# Patient Record
Sex: Male | Born: 1990 | Race: White | Hispanic: No | Marital: Single | Smoking: Current every day smoker
Health system: Southern US, Community
[De-identification: ages and names within clinical notes are randomized; demographics above are authoritative.]

## PROBLEM LIST (undated history)

## (undated) HISTORY — PX: LEG SURGERY: SHX1003

## (undated) HISTORY — PX: ORIF TIBIA & FIBULA FRACTURES: SHX2131

---
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
## 2012-04-27 ENCOUNTER — Encounter (HOSPITAL_BASED_OUTPATIENT_CLINIC_OR_DEPARTMENT_OTHER): Payer: Self-pay | Admitting: *Deleted

## 2012-04-27 ENCOUNTER — Emergency Department (HOSPITAL_BASED_OUTPATIENT_CLINIC_OR_DEPARTMENT_OTHER): Payer: Self-pay

## 2012-04-27 ENCOUNTER — Inpatient Hospital Stay (HOSPITAL_BASED_OUTPATIENT_CLINIC_OR_DEPARTMENT_OTHER)
Admission: EM | Admit: 2012-04-27 | Discharge: 2012-04-29 | DRG: 194 | Disposition: A | Discharge status: Home / Self Care | Payer: Self-pay | Attending: Internal Medicine | Admitting: Internal Medicine

## 2012-04-27 DIAGNOSIS — J189 Pneumonia, unspecified organism: Principal | ICD-10-CM | POA: Diagnosis present

## 2012-04-27 DIAGNOSIS — D72829 Elevated white blood cell count, unspecified: Secondary | ICD-10-CM | POA: Diagnosis present

## 2012-04-27 DIAGNOSIS — F17200 Nicotine dependence, unspecified, uncomplicated: Secondary | ICD-10-CM | POA: Diagnosis present

## 2012-04-27 DIAGNOSIS — E871 Hypo-osmolality and hyponatremia: Secondary | ICD-10-CM | POA: Diagnosis present

## 2012-04-27 DIAGNOSIS — R0902 Hypoxemia: Secondary | ICD-10-CM | POA: Diagnosis present

## 2012-04-27 DIAGNOSIS — R4182 Altered mental status, unspecified: Secondary | ICD-10-CM | POA: Diagnosis present

## 2012-04-27 LAB — BASIC METABOLIC PANEL
CO2: 27 mEq/L (ref 19–32)
Chloride: 97 mEq/L (ref 96–112)
Creatinine, Ser: 1.2 mg/dL (ref 0.50–1.35)
Glucose, Bld: 127 mg/dL — ABNORMAL HIGH (ref 70–99)

## 2012-04-27 LAB — CBC WITH DIFFERENTIAL/PLATELET
Eosinophils Relative: 2 % (ref 0–5)
HCT: 45 % (ref 39.0–52.0)
Lymphocytes Relative: 3 % — ABNORMAL LOW (ref 12–46)
Lymphs Abs: 0.7 10*3/uL (ref 0.7–4.0)
MCV: 84.1 fL (ref 78.0–100.0)
Monocytes Absolute: 1.3 10*3/uL — ABNORMAL HIGH (ref 0.1–1.0)
RBC: 5.35 MIL/uL (ref 4.22–5.81)
RDW: 12.2 % (ref 11.5–15.5)
WBC: 21.9 10*3/uL — ABNORMAL HIGH (ref 4.0–10.5)

## 2012-04-27 MED ORDER — SODIUM CHLORIDE 0.9 % IV SOLN: INTRAVENOUS | Status: DC

## 2012-04-27 MED ORDER — DEXTROSE 5 % IV SOLN
500.0000 mg | Freq: Once | INTRAVENOUS | Status: AC
  Administered 2012-04-27: 500 mg via INTRAVENOUS
  Filled 2012-04-27: qty 500

## 2012-04-27 MED ORDER — SODIUM CHLORIDE 0.9 % IV BOLUS (SEPSIS)
2000.0000 mL | Freq: Once | INTRAVENOUS | Status: AC
  Administered 2012-04-28: 2000 mL via INTRAVENOUS

## 2012-04-27 MED ORDER — SODIUM CHLORIDE 0.9 % IV BOLUS (SEPSIS)
1000.0000 mL | Freq: Once | INTRAVENOUS | Status: AC
  Administered 2012-04-27: 1000 mL via INTRAVENOUS

## 2012-04-27 MED ORDER — AZITHROMYCIN 250 MG PO TABS: 250.0000 mg | ORAL_TABLET | Freq: Every day | ORAL | Status: DC

## 2012-04-27 MED ORDER — DEXTROSE 5 % IV SOLN
2.0000 g | Freq: Once | INTRAVENOUS | Status: AC
  Administered 2012-04-27: 2 g via INTRAVENOUS
  Filled 2012-04-27: qty 2

## 2012-04-27 NOTE — ED Provider Notes (Addendum)
Medical screening examination/treatment/procedure(s) were conducted as a shared visit with non-physician practitioner(s) and myself.  I personally evaluated the patient during the encounter  2324 ED Dispo plan to admit and order BCs and antibiotics IV.  Pt with 2 days cough, weakness, fatigue, sleeping more than usual, short of breath, girlfriend's mom states Pt was able to walk himself to car tonight, but appears confused now in ED after Pt to Nsg and PA initially did not appear confused, Pt now oriented to self only not to time or place, O2 sat 90-96%, L:CTA, tachypneic without resp distress and able to speak full sentences, Pt alternates between falling asleep during questioning and waking up to swear and state he is leaving but does not appear to have capacity to leave AMA, Pt's girlfriend and her mom at bedside understand and are trying to convince Pt to cooperate for admit/transfer. 2345  [facility], Pt with mild hypoxia in ED RA 87% improves to mid-90s with N/C O2; Pt now cooperative and agrees to transfer.0100  CRITICAL CARE Performed by: Hurman Horn   Total critical care time:  Critical care time was exclusive of separately billable procedures and treating other patients.  Critical care was necessary to treat or prevent imminent or life-threatening deterioration.  Critical care was time spent personally by me on the following activities: development of treatment plan with patient and/or surrogate as well as nursing, discussions with consultants, evaluation of patient's response to treatment, examination of patient, obtaining history from patient or surrogate, ordering and performing treatments and interventions, ordering and review of laboratory studies, ordering and review of radiographic studies, pulse oximetry and re-evaluation of patient's condition.  Hurman Horn, MD 04/28/12 1603  Hurman Horn, MD 05/03/12 2152

## 2012-04-27 NOTE — ED Notes (Signed)
Pt states he has been sick for 2 days with a cough and fever. Passed out x 2. Not taking deep breaths. BBS-diminished

## 2012-04-27 NOTE — ED Provider Notes (Signed)
History     CSN: [id number]  Arrival date & time 04/27/12  2012   First MD Initiated Contact with Patient 04/27/12 2037      Chief Complaint  Patient presents with  . Shortness of Breath    (Consider location/radiation/quality/duration/timing/severity/associated sxs/prior treatment) HPI Comments: Patient comes in today with a chief complaint of fever, dry cough, decreased appetite, fatigue, headache, nausea, decreased energy, and body aches.  Symptoms began yesterday and have become progressively worse.  He is unsure how high his fever has been because he has not taken his temperature.  He denies any vomiting or diarrhea.  He also reports that he has had several episodes of near syncope today.  He has been feeling dizzy and lightheaded intermittently throughout the day.  Friends with him today state that they haven't actually seen him pass out.  He just puts his head back and will fall asleep for 45 minutes.   He denies any changes in his vision.  Denies neck pain or stiffness.  Denies rash.  Denies chest pain.  No sick contacts.  He has not taken anything for his symptoms.  He does smoke cigarettes.  He states that he just started smoking one week ago.  He does not have a PCP.  The history is provided by the patient.    History reviewed. No pertinent past medical history.  Past Surgical History  Procedure Date  . Leg surgery     History reviewed. No pertinent family history.  History  Substance Use Topics  . Smoking status: Current Some Day Smoker  . Smokeless tobacco: Not on file  . Alcohol Use: Yes      Review of Systems  Constitutional: Positive for fever, chills, appetite change and fatigue.  HENT: Negative for sore throat, neck pain and neck stiffness.   Eyes: Negative for photophobia and visual disturbance.  Respiratory: Positive for cough and shortness of breath. Negative for wheezing.   Cardiovascular: Negative for chest pain and leg swelling.  Gastrointestinal:  Positive for nausea. Negative for vomiting, abdominal pain and diarrhea.  Skin: Negative for rash.  Neurological: Positive for dizziness, light-headedness and headaches. Negative for syncope.  Psychiatric/Behavioral: Negative for confusion.    Allergies  Review of patient's allergies indicates no known allergies.  Home Medications   Current Outpatient Rx  Name Route Sig Dispense Refill  . IBUPROFEN 200 MG PO TABS Oral Take 400 mg by mouth every 6 (six) hours as needed. For headache.      BP 117/68  Pulse 120  Temp 99.9 F (37.7 C) (Oral)  Resp 18  Ht 5\' 11"  (1.803 m)  Wt 150 lb (68.04 kg)  BMI 20.92 kg/m2  SpO2 94%  Physical Exam  Nursing note and vitals reviewed. Constitutional: He is oriented to person, place, and time. He appears well-developed and well-nourished. No distress.  HENT:  Head: Normocephalic and atraumatic.  Right Ear: Tympanic membrane and ear canal normal.  Left Ear: Tympanic membrane and ear canal normal.  Nose: Nose normal.  Mouth/Throat: Oropharynx is clear and moist and mucous membranes are normal.  Eyes: EOM are normal. Pupils are equal, round, and reactive to light.  Neck: Normal range of motion. Neck supple.  Cardiovascular: Normal rate, regular rhythm and normal heart sounds.   Pulmonary/Chest: Effort normal. No respiratory distress. He has decreased breath sounds. He has no wheezes.  Abdominal: Soft. Bowel sounds are normal. He exhibits no distension and no mass. There is no tenderness. There is no rebound and  no guarding.  Musculoskeletal: Normal range of motion.  Neurological: He is alert and oriented to person, place, and time.  Skin: Skin is warm and dry. He is not diaphoretic.  Psychiatric: He has a normal mood and affect.    ED Course  Procedures (including critical care time)  Labs Reviewed - No data to display Dg Chest 2 View  04/27/2012  *RADIOLOGY REPORT*  Clinical Data: 21 year old male with cough, fever, syncope, sickness.   CHEST - 2 VIEW  Comparison: None.  Findings: Widespread increased interstitial markings, mostly linear and peri hilar.  No definite pleural effusion.  No consolidation or confluent pulmonary opacity. Normal cardiac size and mediastinal contours.  Visualized tracheal air column is within normal limits. No pneumothorax. No acute osseous abnormality identified.  IMPRESSION: Abnormal increased linear/interstitial markings in both lungs suspicious for acute viral / atypical pneumonia.  Original Report Authenticated By: Harley Hallmark, M.D.     No diagnosis found.   Date: 04/27/2012  Rate: 104  Rhythm: sinus tachycardia  QRS Axis: normal  Intervals: normal  ST/T Wave abnormalities: normal  Conduction Disutrbances:none  Narrative Interpretation:   Old EKG Reviewed: none available  10:00 PM Patient signed out to Dr. Fonnie Jarvis.  Patient currently getting IVF.  BMP pending.  MDM  Patient presenting today with cough, fever, headache, body aches, and fatigue.  CXR shows possible atypical Pneumonia.  CBC shows elevated WBC.  BMP pending.  Patient has also been feeling lightheaded intermittently.  Patient receiving IVF.          Pascal Lux Dahlgren, PA-C 04/29/12 1237

## 2012-04-27 NOTE — ED Notes (Signed)
Called to room because visitor concerned with O2 sat dropping. Pt was resting quietly. No distress. Encouraged to take deep breaths. Sats increased to 100% on RA. Dr. Fonnie Jarvis in to talk with pt and visitor.

## 2012-04-28 ENCOUNTER — Encounter (HOSPITAL_COMMUNITY): Payer: Self-pay | Admitting: *Deleted

## 2012-04-28 DIAGNOSIS — J189 Pneumonia, unspecified organism: Secondary | ICD-10-CM | POA: Diagnosis present

## 2012-04-28 DIAGNOSIS — R0902 Hypoxemia: Secondary | ICD-10-CM | POA: Diagnosis present

## 2012-04-28 DIAGNOSIS — D72829 Elevated white blood cell count, unspecified: Secondary | ICD-10-CM

## 2012-04-28 DIAGNOSIS — R4182 Altered mental status, unspecified: Secondary | ICD-10-CM | POA: Diagnosis present

## 2012-04-28 DIAGNOSIS — E871 Hypo-osmolality and hyponatremia: Secondary | ICD-10-CM | POA: Diagnosis present

## 2012-04-28 LAB — ACETAMINOPHEN LEVEL: Acetaminophen (Tylenol), Serum: <15 ug/mL (ref 10–30)

## 2012-04-28 LAB — HEPATIC FUNCTION PANEL
AST: 7 U/L (ref 0–37)
Albumin: 3.3 g/dL — ABNORMAL LOW (ref 3.5–5.2)
Bilirubin, Direct: 0.3 mg/dL (ref 0.0–0.3)

## 2012-04-28 LAB — URINALYSIS, ROUTINE W REFLEX MICROSCOPIC
Glucose, UA: NEGATIVE mg/dL
Hgb urine dipstick: NEGATIVE
Ketones, ur: 15 mg/dL — AB
pH: 7.5 (ref 5.0–8.0)

## 2012-04-28 LAB — RAPID URINE DRUG SCREEN, HOSP PERFORMED
Opiates: NOT DETECTED
Tetrahydrocannabinol: NOT DETECTED

## 2012-04-28 LAB — SALICYLATE LEVEL: Salicylate Lvl: <2 mg/dL — ABNORMAL LOW (ref 2.8–20.0)

## 2012-04-28 LAB — URINE MICROSCOPIC-ADD ON

## 2012-04-28 MED ORDER — ACETAMINOPHEN 325 MG PO TABS: 650.0000 mg | ORAL_TABLET | Freq: Four times a day (QID) | ORAL | Status: DC | PRN

## 2012-04-28 MED ORDER — OXYCODONE HCL 5 MG PO TABS: 5.0000 mg | ORAL_TABLET | ORAL | Status: DC | PRN

## 2012-04-28 MED ORDER — SODIUM CHLORIDE 0.9 % IV SOLN: INTRAVENOUS | Status: DC

## 2012-04-28 MED ORDER — ONDANSETRON HCL 4 MG PO TABS: 4.0000 mg | ORAL_TABLET | Freq: Four times a day (QID) | ORAL | Status: DC | PRN

## 2012-04-28 MED ORDER — ALBUTEROL SULFATE (5 MG/ML) 0.5% IN NEBU
2.5000 mg | INHALATION_SOLUTION | Freq: Four times a day (QID) | RESPIRATORY_TRACT | Status: DC
  Administered 2012-04-28: 2.5 mg via RESPIRATORY_TRACT
  Filled 2012-04-28: qty 0.5

## 2012-04-28 MED ORDER — ZOLPIDEM TARTRATE 5 MG PO TABS: 5.0000 mg | ORAL_TABLET | Freq: Every evening | ORAL | Status: DC | PRN

## 2012-04-28 MED ORDER — GUAIFENESIN ER 600 MG PO TB12
600.0000 mg | ORAL_TABLET | Freq: Two times a day (BID) | ORAL | Status: DC
  Administered 2012-04-28 – 2012-04-29 (×2): 600 mg via ORAL
  Filled 2012-04-28 (×3): qty 1

## 2012-04-28 MED ORDER — ALBUTEROL SULFATE (5 MG/ML) 0.5% IN NEBU: 2.5000 mg | INHALATION_SOLUTION | RESPIRATORY_TRACT | Status: DC | PRN

## 2012-04-28 MED ORDER — DEXTROSE 5 % IV SOLN
500.0000 mg | INTRAVENOUS | Status: DC
  Administered 2012-04-28: 500 mg via INTRAVENOUS
  Filled 2012-04-28 (×2): qty 500

## 2012-04-28 MED ORDER — ONDANSETRON HCL 4 MG/2ML IJ SOLN: 4.0000 mg | Freq: Four times a day (QID) | INTRAMUSCULAR | Status: DC | PRN

## 2012-04-28 MED ORDER — ACETAMINOPHEN 325 MG PO TABS
650.0000 mg | ORAL_TABLET | Freq: Once | ORAL | Status: AC
  Administered 2012-04-28: 650 mg via ORAL
  Filled 2012-04-28: qty 2

## 2012-04-28 MED ORDER — ALBUTEROL SULFATE HFA 108 (90 BASE) MCG/ACT IN AERS
2.0000 | INHALATION_SPRAY | Freq: Three times a day (TID) | RESPIRATORY_TRACT | Status: DC
  Administered 2012-04-28 – 2012-04-29 (×3): 2 via RESPIRATORY_TRACT
  Filled 2012-04-28: qty 6.7

## 2012-04-28 MED ORDER — HYDROMORPHONE HCL PF 1 MG/ML IJ SOLN: 0.5000 mg | INTRAMUSCULAR | Status: DC | PRN

## 2012-04-28 MED ORDER — ACETAMINOPHEN 650 MG RE SUPP: 650.0000 mg | Freq: Four times a day (QID) | RECTAL | Status: DC | PRN

## 2012-04-28 MED ORDER — ALUM & MAG HYDROXIDE-SIMETH 200-200-20 MG/5ML PO SUSP: 30.0000 mL | Freq: Four times a day (QID) | ORAL | Status: DC | PRN

## 2012-04-28 MED ORDER — ALBUTEROL SULFATE HFA 108 (90 BASE) MCG/ACT IN AERS
2.0000 | INHALATION_SPRAY | RESPIRATORY_TRACT | Status: DC | PRN
  Filled 2012-04-28: qty 6.7

## 2012-04-28 MED ORDER — DEXTROSE 5 % IV SOLN
1.0000 g | INTRAVENOUS | Status: DC
  Administered 2012-04-29: 1 g via INTRAVENOUS
  Filled 2012-04-28 (×3): qty 10

## 2012-04-28 MED ORDER — ENOXAPARIN SODIUM 40 MG/0.4ML ~~LOC~~ SOLN
40.0000 mg | SUBCUTANEOUS | Status: DC
  Filled 2012-04-28 (×2): qty 0.4

## 2012-04-28 NOTE — H&P (Signed)
[facility] History and Physical  Nicholas Ellis ZOX:[id number] DOB: [birth year] DOA: 04/27/2012  Referring physician: EDP PCP: No primary provider on file.   Chief Complaint: Cough Chest Congestion, Fever  HPI:  21 year old male who presented to the [facility] ED with complaints of increased cough which has been nonproductive, fever and chills X 2 days.  He does report having a decreased appetite and nausea but no vomiting.  He reports passing out yesterday.  He has had increased weakness and fatigue.  In the Ed he was evaluated and found to have hypoxia with O2 sats decreasing to 87% on Room Air.  He improved after being placed on 2 liter [place] O2.  A Chest X-Ray revealed an atypical pneumonia.  He was placed on IV antibiotic therapy of Rocephin and Azithromycin.    Review of Systems:  The patient denies anorexia, fever, weight loss, vision loss, decreased hearing, hoarseness, chest pain, dyspnea on exertion, peripheral edema, balance deficits, hemoptysis, abdominal pain, melena, hematochezia, severe indigestion/heartburn, hematuria, incontinence, genital sores, muscle weakness, suspicious skin lesions, transient blindness, difficulty walking, depression, unusual weight change, abnormal bleeding, enlarged lymph nodes, angioedema, and breast masses.    History reviewed. No pertinent past medical history.   Past Surgical History  Procedure Date  . Leg surgery   . Broke knee 2010 2010    Prior to Admission medications   Medication Sig Start Date End Date Taking? Authorizing Provider  ibuprofen (ADVIL,MOTRIN) 200 MG tablet Take 400 mg by mouth every 6 (six) hours as needed. For headache.   Yes Historical Provider, MD    Allergies:  No Known Allergies   Social History:  reports that he has been smoking.  His smokeless tobacco use includes Chew. He reports that he drinks about 54 ounces of alcohol per week. He reports that he does not use illicit drugs.    Family History  Problem  Relation Age of Onset  . Breast cancer Maternal Aunt   . Breast cancer Maternal Aunt   . Breast cancer Maternal Aunt      Physical Exam:  GEN:  Pleasant  21 year old well nourished and well developed Caucasian Male examined  and in no acute distress; cooperative with exam Filed Vitals:   04/28/12 0216 04/28/12 0314 04/28/12 0409 04/28/12 0411  BP: 94/56 102/61  107/58  Pulse: 75 78  83  Temp:  97.9 F (36.6 C)  97.3 F (36.3 C)  TempSrc:  Oral  Oral  Resp: 16 16  16   Height:   5\' 9"  (1.753 m)   Weight:   63.594 kg (140 lb 3.2 oz)   SpO2: 98% 97%  96%   Blood pressure 107/58, pulse 83, temperature 97.3 F (36.3 C), temperature source Oral, resp. rate 16, height 5\' 9"  (1.753 m), weight 63.594 kg (140 lb 3.2 oz), SpO2 96.00%. PSYCH: He is alert and oriented x4; does not appear anxious does not appear depressed; affect is normal HEENT: Normocephalic and Atraumatic, Mucous membranes pink; PERRLA; EOM intact; Fundi:  Benign;  No scleral icterus, Nares: Patent, Oropharynx: Clear, Fair Dentition, Neck:  FROM, no cervical lymphadenopathy nor thyromegaly or carotid bruit; no JVD; Breasts:: Not examined CHEST WALL: No tenderness CHEST: Normal respiration, clear to auscultation bilaterally HEART: Regular rate and rhythm; no murmurs rubs or gallops BACK: No kyphosis or scoliosis; no CVA tenderness ABDOMEN: Positive Bowel Sounds, Scaphoid, soft non-tender; no masses, no organomegaly.   Rectal Exam: Not done EXTREMITIES: No bone or joint deformity; age-appropriate arthropathy of  the hands and knees; no cyanosis, clubbing or edema; no ulcerations. Genitalia: not examined PULSES: 2+ and symmetric SKIN: Normal hydration no rash or ulceration CNS: Cranial nerves 2-12 grossly intact no focal neurologic deficit   Labs on Admission:  Basic Metabolic Panel:  Lab 04/27/12 1610  NA 134*  K 3.9  CL 97  CO2 27  GLUCOSE 127*  BUN 14  CREATININE 1.20  CALCIUM 9.6  MG --  PHOS --   Liver  Function Tests:  Lab 04/28/12 0011  AST 7  ALT 6  ALKPHOS 55  BILITOT 1.8*  PROT 6.4  ALBUMIN 3.3*   No results found for this basename: LIPASE:5,AMYLASE:5 in the last 168 hours No results found for this basename: AMMONIA:5 in the last 168 hours CBC:  Lab 04/27/12 2154  WBC 21.9*  NEUTROABS 19.5*  HGB 16.0  HCT 45.0  MCV 84.1  PLT 183   Cardiac Enzymes: No results found for this basename: CKTOTAL:5,CKMB:5,CKMBINDEX:5,TROPONINI:5 in the last 168 hours  BNP (last 3 results) No results found for this basename: PROBNP:3 in the last 8760 hours CBG: No results found for this basename: GLUCAP:5 in the last 168 hours  Radiological Exams on Admission: Dg Chest 2 View  04/27/2012  *RADIOLOGY REPORT*  Clinical Data: 21 year old male with cough, fever, syncope, sickness.  CHEST - 2 VIEW  Comparison: None.  Findings: Widespread increased interstitial markings, mostly linear and peri hilar.  No definite pleural effusion.  No consolidation or confluent pulmonary opacity. Normal cardiac size and mediastinal contours.  Visualized tracheal air column is within normal limits. No pneumothorax. No acute osseous abnormality identified.  IMPRESSION: Abnormal increased linear/interstitial markings in both lungs suspicious for acute viral / atypical pneumonia.  Original Report Authenticated By: Harley Hallmark, M.D.    Assessment: Principal Problem:  *CAP (community acquired pneumonia) Active Problems:  Altered mental status  Hypoxia  Leukocytosis  Hyponatremia   Plan:    Admit to Telelmetry Bed IV Antibiotics to Cover CAP with Rocephin and Azithromycin Nebulizer Rxs, , and O2 PRN Reconcile Home Medications DVT prophylaxis   Code Status: FULL CODE Family Communication: Family at Bedside Disposition Plan: Return to Home  Time spent: 60 minutes  Ron Parker [facility] Pager [phone]  If 7PM-7AM, please contact night-coverage [email or web address] Password [facility] 04/28/2012,  6:33 AM

## 2012-04-28 NOTE — ED Notes (Signed)
Report called to [phone] RN

## 2012-04-28 NOTE — ED Notes (Signed)
Patient placed on 2L o2 due to decreased sats of 87%. Now increased to 94%

## 2012-04-28 NOTE — Progress Notes (Signed)
RT assessment done at this time. Patient is slightly SOB with exertion. BBS slightly diminished but clear. SAT 98% on 2 LNC. I feel that the patient would benefit from Albuterol MDI with Spacer TID. He is able to do a good breath hold. This way he will be able to do MDI upon discharge. RT will continue to monitor.

## 2012-04-28 NOTE — Progress Notes (Signed)
Patient admitted earlier today to [facility] with cough, fever and chills. He was found to have pneumonia. He was started on community-acquired coverage with Rocephin and azithromycin. Follow fever curve and leukocytosis. Will check HIV status.  Peggye Pitt, MD [facility] Pager: [phone]

## 2012-04-29 LAB — BASIC METABOLIC PANEL
CO2: 24 mEq/L (ref 19–32)
Chloride: 106 mEq/L (ref 96–112)
GFR calc Af Amer: >90 mL/min (ref >90)
Potassium: 3.6 mEq/L (ref 3.5–5.1)

## 2012-04-29 LAB — URINE CULTURE: Colony Count: NO GROWTH

## 2012-04-29 LAB — CBC
HCT: 39.3 % (ref 39.0–52.0)
Hemoglobin: 13.4 g/dL (ref 13.0–17.0)
MCV: 86.2 fL (ref 78.0–100.0)
WBC: 8.6 10*3/uL (ref 4.0–10.5)

## 2012-04-29 LAB — HIV ANTIBODY (ROUTINE TESTING W REFLEX): HIV: NONREACTIVE

## 2012-04-29 MED ORDER — LEVOFLOXACIN 500 MG PO TABS: 500.0000 mg | ORAL_TABLET | Freq: Every day | ORAL | Status: AC

## 2012-04-29 MED ORDER — GUAIFENESIN ER 600 MG PO TB12: 600.0000 mg | ORAL_TABLET | Freq: Two times a day (BID) | ORAL | Status: AC

## 2012-04-29 MED ORDER — ALBUTEROL SULFATE HFA 108 (90 BASE) MCG/ACT IN AERS: 2.0000 | INHALATION_SPRAY | RESPIRATORY_TRACT | Status: AC | PRN

## 2012-04-29 NOTE — Progress Notes (Signed)
Pt d/c home via ambulation.  Decline W/C service.  Amanda Pea, Charity fundraiser.

## 2012-04-29 NOTE — Discharge Summary (Signed)
Physician Discharge Summary  Patient ID: Nicholas Ellis MRN: [id number] DOB/AGE: 10/21/1990 21 y.o.  Admit date: 04/27/2012 Discharge date: 04/29/2012  Primary Care Physician:  No primary provider on file.   Discharge Diagnoses:    Principal Problem:  *CAP (community acquired pneumonia) Active Problems:  Altered mental status  Hypoxia  Leukocytosis  Hyponatremia    Medication List  As of 04/29/2012 10:07 AM   STOP taking these medications         ibuprofen 200 MG tablet         TAKE these medications         albuterol 108 (90 BASE) MCG/ACT inhaler   Commonly known as: PROVENTIL HFA;VENTOLIN HFA   Inhale 2 puffs into the lungs every 4 (four) hours as needed for wheezing or shortness of breath.      guaiFENesin 600 MG 12 hr tablet   Commonly known as: MUCINEX   Take 1 tablet (600 mg total) by mouth 2 (two) times daily.      levofloxacin 500 MG tablet   Commonly known as: LEVAQUIN   Take 1 tablet (500 mg total) by mouth daily.             Disposition and Follow-up:  Patient will be discharged home today in stable and improved condition. Will need primary care followup in about 10 days.  Consults:  None    Significant Diagnostic Studies:  Dg Chest 2 View  04/27/2012  *RADIOLOGY REPORT*  Clinical Data: 21 year old male with cough, fever, syncope, sickness.  CHEST - 2 VIEW  Comparison: None.  Findings: Widespread increased interstitial markings, mostly linear and peri hilar.  No definite pleural effusion.  No consolidation or confluent pulmonary opacity. Normal cardiac size and mediastinal contours.  Visualized tracheal air column is within normal limits. No pneumothorax. No acute osseous abnormality identified.  IMPRESSION: Abnormal increased linear/interstitial markings in both lungs suspicious for acute viral / atypical pneumonia.  Original Report Authenticated By: Harley Hallmark, M.D.    Brief H and P: For complete details please refer to admission H and P,  but in brief patient is a 21 year old young man who presented with increased cough fever and chills. Upon arrival to the emergency department he was found to be hypoxic to 87% on room air. A chest x-ray revealed an atypical pneumonia and we were asked to admit him for further evaluation and management.    Hospital Course:  Principal Problem:  *CAP (community acquired pneumonia) Active Problems:  Altered mental status  Hypoxia  Leukocytosis  Hyponatremia   #1 community-acquired pneumonia: He has been afebrile since admission. His leukocytosis has decreased from 21,000 to normal. His cough has greatly improved. He is no longer requiring oxygen and saturating mid to high 90s on room air. I believe he is ready for discharge home on 10 more days of by mouth Levaquin. He has been  instructed to quit smoking.  Time spent on Discharge: Greater than 30 minutes.  SignedChaya Jan [facility] Pager: [phone] 04/29/2012, 10:07 AM

## 2012-05-04 LAB — CULTURE, BLOOD (ROUTINE X 2): Culture: NO GROWTH

## 2013-07-09 ENCOUNTER — Ambulatory Visit: Payer: Self-pay | Admitting: General Practice

## 2013-07-24 ENCOUNTER — Ambulatory Visit: Payer: Self-pay | Admitting: General Practice

## 2013-09-16 ENCOUNTER — Emergency Department: Payer: Self-pay | Admitting: Emergency Medicine

## 2013-09-17 LAB — RAPID INFLUENZA A&B ANTIGENS (ARMC ONLY)

## 2014-09-21 IMAGING — CR DG WRIST COMPLETE 3+V*R*
1 series · 4 of 4 positions shown · non-contrast
Comparison: none

REASON FOR EXAM: ulnar wrist pain
COMMENTS:

PROCEDURE:     DXR - DXR WRIST RT COMP WITH OBLIQUES  - July 09, 2013  [DATE]
RESULT:     Right wrist images demonstrate possible tiny avulsion along the
medial base of the right fifth metacarpal. Correlate clinically. The carpus
appears unremarkable. The bony structures otherwise appear intact.

[Series 1: x wrist pa right · 0.14mm/px · 4 of 4 slices shown]
[im 1/4]
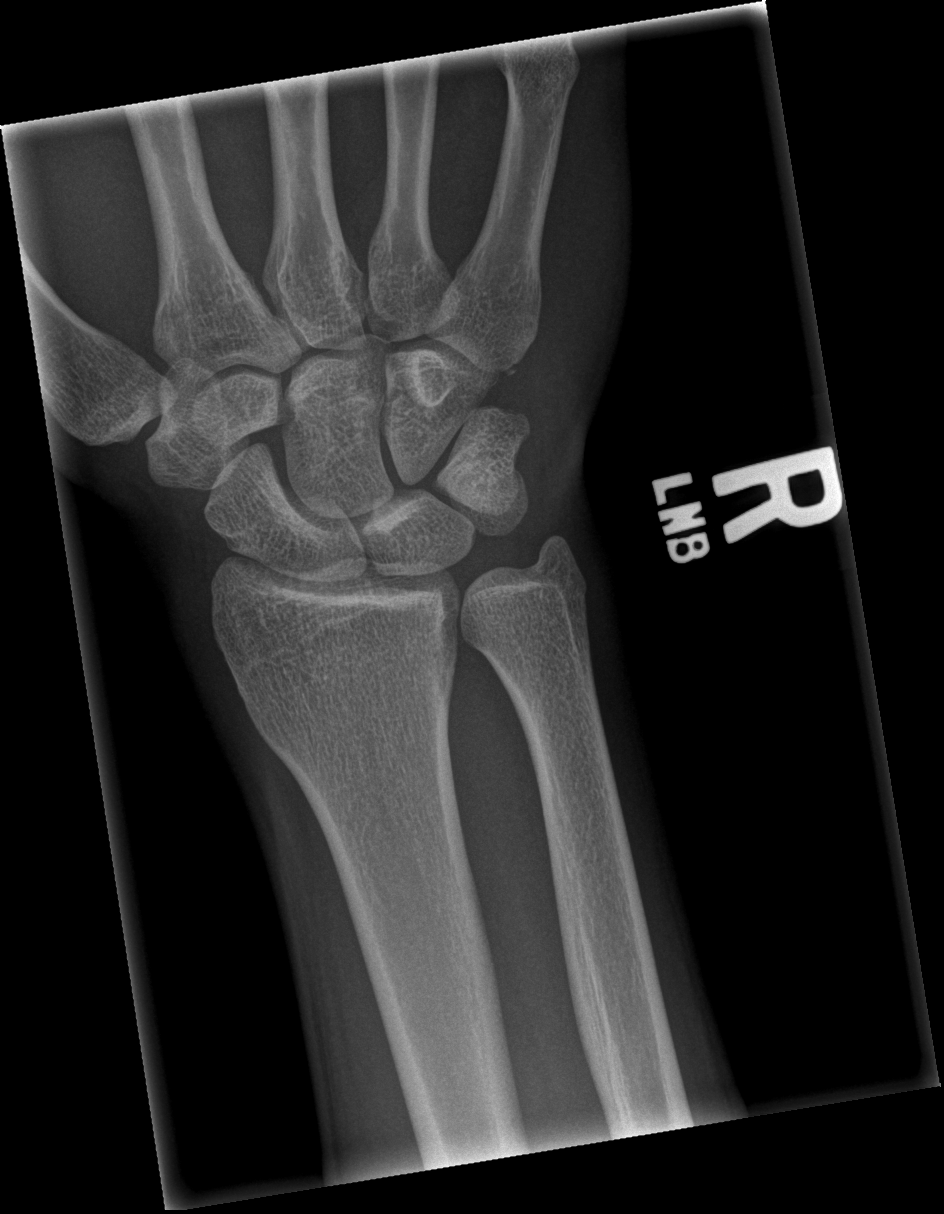
[im 2/4]
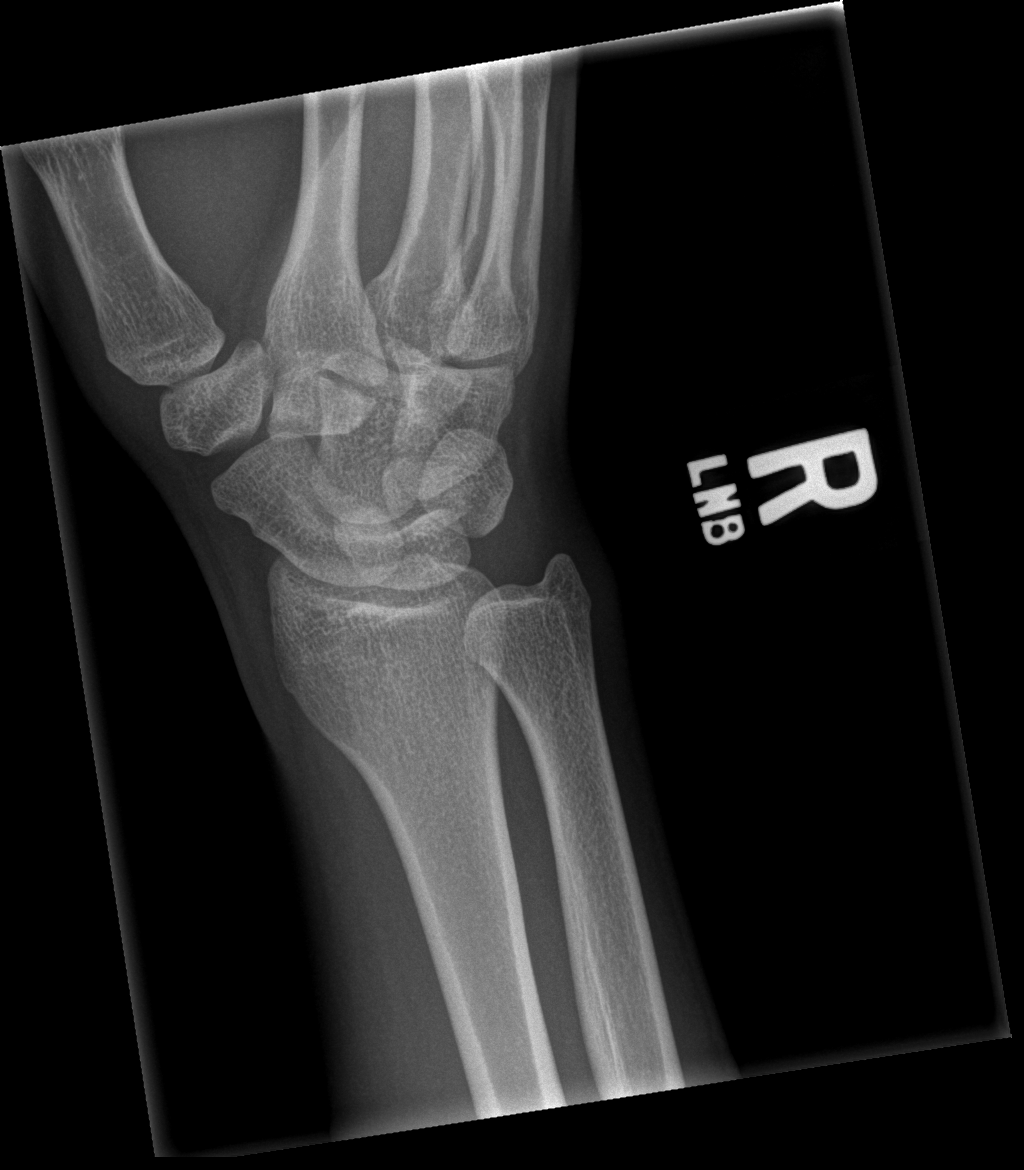
[im 3/4]
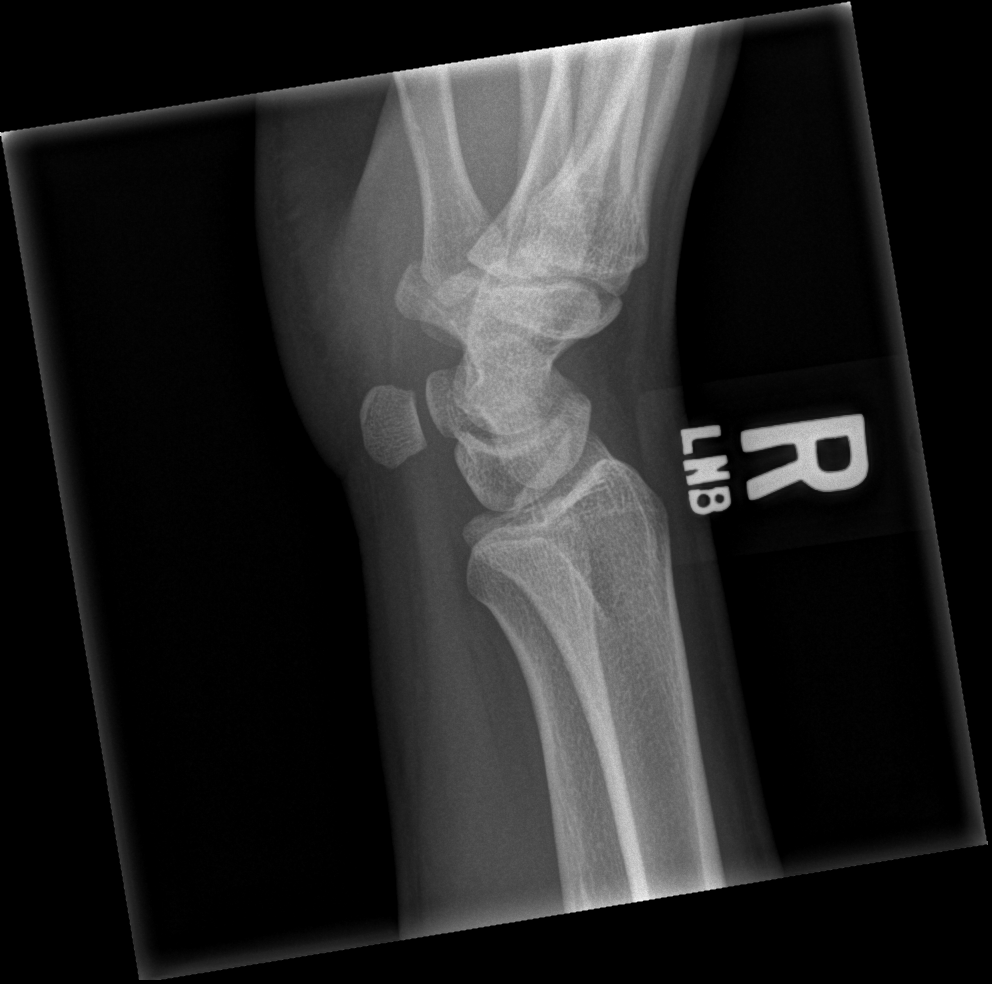
[im 4/4]
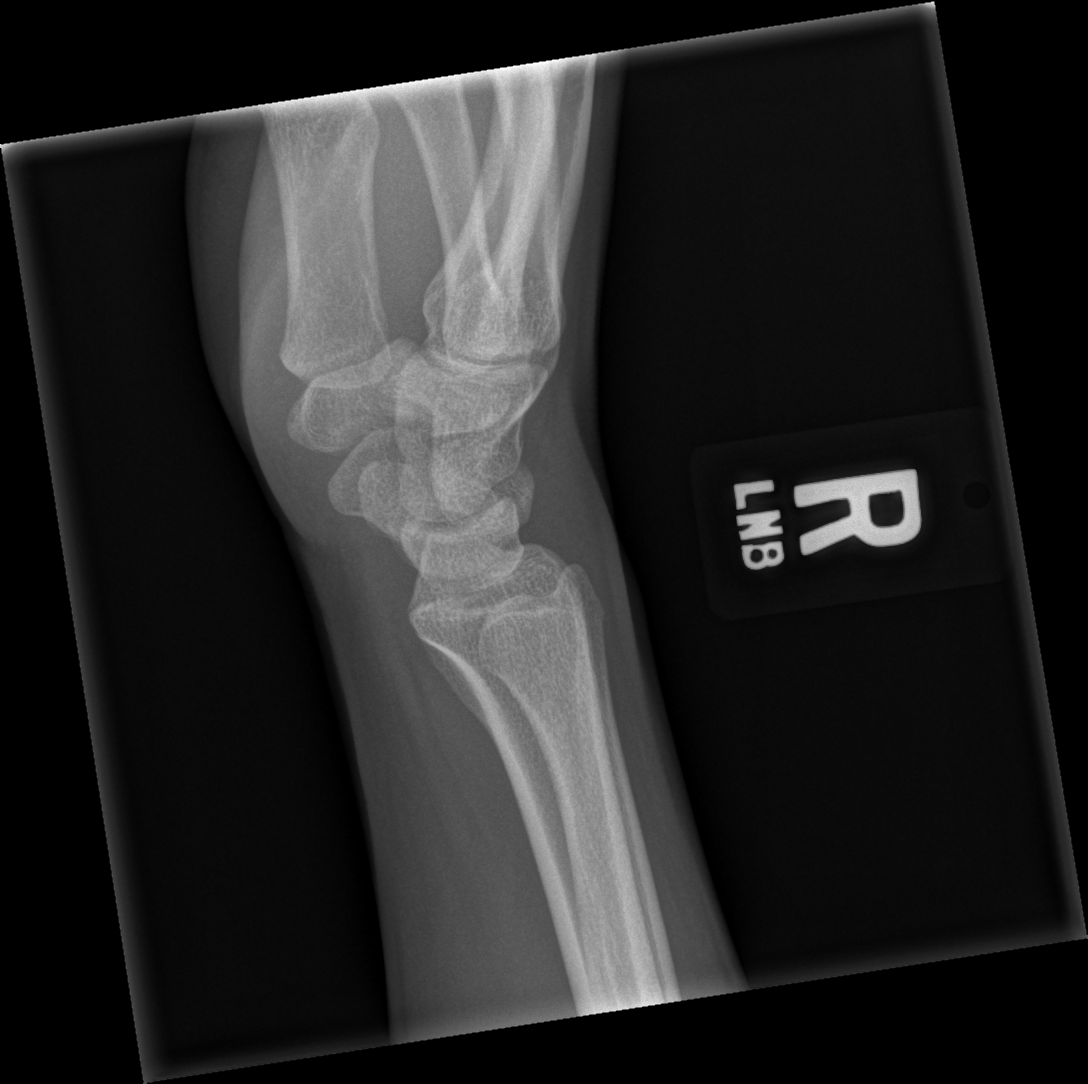

[4 of 4 positions shown; findings below may reference images not displayed]

IMPRESSION: Please see above.

[REDACTED]

## 2015-06-13 ENCOUNTER — Emergency Department: Payer: Self-pay

## 2015-06-13 ENCOUNTER — Encounter: Payer: Self-pay | Admitting: Emergency Medicine

## 2015-06-13 DIAGNOSIS — R202 Paresthesia of skin: Secondary | ICD-10-CM | POA: Insufficient documentation

## 2015-06-13 DIAGNOSIS — Z720 Tobacco use: Secondary | ICD-10-CM | POA: Insufficient documentation

## 2015-06-13 DIAGNOSIS — R0789 Other chest pain: Secondary | ICD-10-CM | POA: Insufficient documentation

## 2015-06-13 DIAGNOSIS — R0602 Shortness of breath: Secondary | ICD-10-CM | POA: Insufficient documentation

## 2015-06-13 DIAGNOSIS — R002 Palpitations: Secondary | ICD-10-CM | POA: Insufficient documentation

## 2015-06-13 DIAGNOSIS — R200 Anesthesia of skin: Secondary | ICD-10-CM | POA: Insufficient documentation

## 2015-06-13 LAB — CBC
HEMATOCRIT: 43.5 % (ref 40.0–52.0)
HEMOGLOBIN: 15.1 g/dL (ref 13.0–18.0)
MCH: 30.8 pg (ref 26.0–34.0)
MCHC: 34.8 g/dL (ref 32.0–36.0)
MCV: 88.5 fL (ref 80.0–100.0)
Platelets: 189 10*3/uL (ref 150–440)
RBC: 4.92 MIL/uL (ref 4.40–5.90)
RDW: 12.6 % (ref 11.5–14.5)
WBC: 10.2 10*3/uL (ref 3.8–10.6)

## 2015-06-13 NOTE — ED Notes (Signed)
Pt to triage via w/c with no distress; pt reports difficulty breathing all day; denies any recent illness; reports feeling light-headed with mid CP

## 2015-06-14 ENCOUNTER — Emergency Department
Admission: EM | Admit: 2015-06-14 | Discharge: 2015-06-14 | Disposition: A | Discharge status: Home / Self Care | Payer: Self-pay | Attending: Emergency Medicine | Admitting: Emergency Medicine

## 2015-06-14 DIAGNOSIS — R0602 Shortness of breath: Secondary | ICD-10-CM

## 2015-06-14 DIAGNOSIS — R0789 Other chest pain: Secondary | ICD-10-CM

## 2015-06-14 LAB — BASIC METABOLIC PANEL
Anion gap: 5 (ref 5–15)
BUN: 11 mg/dL (ref 6–20)
CHLORIDE: 105 mmol/L (ref 101–111)
CO2: 29 mmol/L (ref 22–32)
CREATININE: 1.14 mg/dL (ref 0.61–1.24)
Calcium: 9.5 mg/dL (ref 8.9–10.3)
GFR calc Af Amer: >60 mL/min (ref >60)
GFR calc non Af Amer: >60 mL/min (ref >60)
Glucose, Bld: 96 mg/dL (ref 65–99)
POTASSIUM: 3.3 mmol/L — AB (ref 3.5–5.1)
Sodium: 139 mmol/L (ref 135–145)

## 2015-06-14 LAB — TROPONIN I: Troponin I: <0.03 ng/mL (ref <0.031)

## 2015-06-14 NOTE — Discharge Instructions (Signed)
You have been seen in the Emergency Department (ED) today for chest tightness and shortness of breath.  As we have discussed, Your symptoms may be the result of drinking way too much monster energy drink.  We recommend that you cut back on your intake by half and see if you have any symptoms after that.  Please follow up with the recommended doctor as instructed above in these documents regarding todays emergent visit and your recent symptoms to discuss further management.    Return to the Emergency Department (ED) if you experience any further chest pain/pressure/tightness, difficulty breathing, or sudden sweating, or other symptoms that concern you.   Chest Pain (Nonspecific) It is often hard to give a specific diagnosis for the cause of chest pain. There is always a chance that your pain could be related to something serious, such as a heart attack or a blood clot in the lungs. You need to follow up with your health care provider for further evaluation. CAUSES   Heartburn.  Pneumonia or bronchitis.  Anxiety or stress.  Inflammation around your heart (pericarditis) or lung (pleuritis or pleurisy).  A blood clot in the lung.  A collapsed lung (pneumothorax). It can develop suddenly on its own (spontaneous pneumothorax) or from trauma to the chest.  Shingles infection (herpes zoster virus). The chest wall is composed of bones, muscles, and cartilage. Any of these can be the source of the pain.  The bones can be bruised by injury.  The muscles or cartilage can be strained by coughing or overwork.  The cartilage can be affected by inflammation and become sore (costochondritis). DIAGNOSIS  Lab tests or other studies may be needed to find the cause of your pain. Your health care provider may have you take a test called an ambulatory electrocardiogram (ECG). An ECG records your heartbeat patterns over a 24-hour period. You may also have other tests, such as:  Transthoracic  echocardiogram (TTE). During echocardiography, sound waves are used to evaluate how blood flows through your heart.  Transesophageal echocardiogram (TEE).  Cardiac monitoring. This allows your health care provider to monitor your heart rate and rhythm in real time.  Holter monitor. This is a portable device that records your heartbeat and can help diagnose heart arrhythmias. It allows your health care provider to track your heart activity for several days, if needed.  Stress tests by exercise or by giving medicine that makes the heart beat faster. TREATMENT   Treatment depends on what may be causing your chest pain. Treatment may include:  Acid blockers for heartburn.  Anti-inflammatory medicine.  Pain medicine for inflammatory conditions.  Antibiotics if an infection is present.  You may be advised to change lifestyle habits. This includes stopping smoking and avoiding alcohol, caffeine, and chocolate.  You may be advised to keep your head raised (elevated) when sleeping. This reduces the chance of acid going backward from your stomach into your esophagus. Most of the time, nonspecific chest pain will improve within 2-3 days with rest and mild pain medicine.  HOME CARE INSTRUCTIONS   If antibiotics were prescribed, take them as directed. Finish them even if you start to feel better.  For the next few days, avoid physical activities that bring on chest pain. Continue physical activities as directed.  Do not use any tobacco products, including cigarettes, chewing tobacco, or electronic cigarettes.  Avoid drinking alcohol.  Only take medicine as directed by your health care provider.  Follow your health care provider's suggestions for further testing  if your chest pain does not go away.  Keep any follow-up appointments you made. If you do not go to an appointment, you could develop lasting (chronic) problems with pain. If there is any problem keeping an appointment, call to  reschedule. SEEK MEDICAL CARE IF:   Your chest pain does not go away, even after treatment.  You have a rash with blisters on your chest.  You have a fever. SEEK IMMEDIATE MEDICAL CARE IF:   You have increased chest pain or pain that spreads to your arm, neck, jaw, back, or abdomen.  You have shortness of breath.  You have an increasing cough, or you cough up blood.  You have severe back or abdominal pain.  You feel nauseous or vomit.  You have severe weakness.  You faint.  You have chills. This is an emergency. Do not wait to see if the pain will go away. Get medical help at once. Call your local emergency services (911 in [place] Do not drive yourself to the hospital. MAKE SURE YOU:   Understand these instructions.  Will watch your condition.  Will get help right away if you are not doing well or get worse. Document Released: 06/07/2005 Document Revised: 09/02/2013 Document Reviewed: 04/02/2008 [facility] Patient Information 2015 [place], [place]. This information is not intended to replace advice given to you by your health care provider. Make sure you discuss any questions you have with your health care provider.

## 2015-06-14 NOTE — ED Notes (Signed)
Pt admits to 7+ monster energy drinks daily.

## 2015-06-14 NOTE — ED Provider Notes (Signed)
[facility] Emergency Department Provider Note  ____________________________________________  Time seen: Approximately 2:40 AM  I have reviewed the triage vital signs and the nursing notes.   HISTORY  Chief Complaint Chest Pain    HPI Nicholas Ellis is a 24 y.o. male with no significant past medical history but who, up until last week, has been drinking beer heavily and has now subsided that for heavy intake of monster energy drinks, presents today to the emergency department with reports of chest tightness and breathing difficulty.  He states that he has actually had a tight feeling for several days but today he was also feeling palpitations, like his heart was pounding, and when the symptoms were the worst and he was breathing rapidly he was also having some facial numbness and numbness down in his arms and hands.  Reportedly he was "drifting in and out" during these episodes.  He has been waiting for a long time in the emergency department and he is completely asymptomatic at this time.  He describes the symptoms as severe although now they have resolved.  When it was happening, nothing made it worse and nothing made it better.  He states that he drinks 7 monster energy drinks today before having his symptoms.  He has not had any beer, which he drank heavily previously, for about 9 days.He denies fever/chills, nausea/vomiting, abdominal pain, dysuria.  He has not had any headache, neck pain, neck stiffness.   History reviewed. No pertinent past medical history.  Patient Active Problem List   Diagnosis Date Noted  . CAP (community acquired pneumonia) 04/28/2012  . Altered mental status 04/28/2012  . Hypoxia 04/28/2012  . Leukocytosis 04/28/2012  . Hyponatremia 04/28/2012    Past Surgical History  Procedure Laterality Date  . Leg surgery    . Broke knee 2010  2010  . Orif tibia & fibula fractures Left     Current Outpatient Rx  Name  Route  Sig   Dispense  Refill  . EXPIRED: albuterol (PROVENTIL HFA;VENTOLIN HFA) 108 (90 BASE) MCG/ACT inhaler   Inhalation   Inhale 2 puffs into the lungs every 4 (four) hours as needed for wheezing or shortness of breath.   1 Inhaler   0     Allergies Review of patient's allergies indicates no known allergies.  Family History  Problem Relation Age of Onset  . Breast cancer Maternal Aunt   . Breast cancer Maternal Aunt   . Breast cancer Maternal Aunt     Social History Social History  Substance Use Topics  . Smoking status: Current Every Day Smoker -- 2.00 packs/day for .5 years    Types: Cigarettes  . Smokeless tobacco: Current User  . Alcohol Use: No    Review of Systems Constitutional: No fever/chills Eyes: No visual changes. ENT: No sore throat. Cardiovascular: Chest tightness Respiratory: Intermittent shortness of breath Gastrointestinal: No abdominal pain.  No nausea, no vomiting.  No diarrhea.  No constipation. Genitourinary: Negative for dysuria. Musculoskeletal: Negative for back pain. Skin: Negative for rash. Neurological: Negative for headaches.  Some tingling in his extremities and his face which is resolved  10-point ROS otherwise negative.  ____________________________________________   PHYSICAL EXAM:  VITAL SIGNS: ED Triage Vitals  Enc Vitals Group     BP 06/13/15 2335 123/77 mmHg     Pulse Rate 06/13/15 2335 67     Resp 06/13/15 2335 18     Temp 06/13/15 2335 98.5 F (36.9 C)  Temp Source 06/13/15 2335 Oral     SpO2 06/13/15 2335 97 %     Weight 06/13/15 2335 140 lb (63.504 kg)     Height 06/13/15 2335  (1.803 m)     Head Cir --      Peak Flow --      Pain Score --      Pain Loc --      Pain Edu? --      Excl. in GC? --     Constitutional: Alert and oriented. Well appearing and in no acute distress. Eyes: Conjunctivae are normal. PERRL. EOMI. Head: Atraumatic. Nose: No congestion/rhinnorhea. Mouth/Throat: Mucous membranes are moist.   Oropharynx non-erythematous. Neck: No stridor.   Cardiovascular: Normal rate, regular rhythm. Grossly normal heart sounds.  Good peripheral circulation. Respiratory: Normal respiratory effort.  No retractions. Lungs CTAB. Gastrointestinal: Soft and nontender. No distention. No abdominal bruits. No CVA tenderness. Musculoskeletal: No lower extremity tenderness nor edema.  No joint effusions. Neurologic:  Normal speech and language. No gross focal neurologic deficits are appreciated.  Skin:  Skin is warm, dry and intact. No rash noted. Psychiatric: Mood and affect are normal. Speech and behavior are normal.  ____________________________________________   LABS (all labs ordered are listed, but only abnormal results are displayed)  Labs Reviewed  BASIC METABOLIC PANEL - Abnormal; Notable for the following:    Potassium 3.3 (*)    All other components within normal limits  CBC  TROPONIN I   ____________________________________________  EKG  ED ECG REPORT I, Aleenah Homen, the attending physician, personally viewed and interpreted this ECG.  Date: 06/13/2015 EKG Time: 23:39 Rate: 63 Rhythm: normal sinus rhythm QRS Axis: normal Intervals: normal ST/T Wave abnormalities: normal Conduction Disutrbances: none Narrative Interpretation: unremarkable  ____________________________________________  RADIOLOGY   Dg Chest 2 View  06/14/2015   CLINICAL DATA:  Difficulty breathing for 1 day, lightheadedness with mid chest pain. History of pneumonia. Smoker.  EXAM: CHEST  2 VIEW  COMPARISON:  Chest radiograph September 16, 2013  FINDINGS: Cardiomediastinal silhouette is normal. The lungs are clear without pleural effusions or focal consolidations. Trachea projects midline and there is no pneumothorax. Soft tissue planes and included osseous structures are non-suspicious. Move Mild broad dextroscoliosis is likely positional as, was not present on prior radiograph.  IMPRESSION: Normal chest.    Electronically Signed   By: Awilda Metro M.D.   On: 06/14/2015 00:08    ____________________________________________   PROCEDURES  Procedure(s) performed: None  Critical Care performed: No ____________________________________________   INITIAL IMPRESSION / ASSESSMENT AND PLAN / ED COURSE  Pertinent labs & imaging results that were available during my care of the patient were reviewed by me and considered in my medical decision making (see chart for details).  Based on the patient's workup and physical exam, I have no clear etiology of his symptoms, although I suspect it is related to the heavy intake of monster energy drinks.  Been having a transient arrhythmia such as an AVNRT which was leading to hyperventilation and the perioral/facial numbness that he was describing.  Regardless, there is no evidence of severe or emergent medical condition at this time and he has been stable in the emergency department for nearly 6 hours.  He is asymptomatic at this time.  I had a long discussion with him and his wife and gave my usual and customary return precautions and follow-up recommendations.  They understand and agree with the plan.  Differential includes all life-threatening causes for chest  pain. This includes but is not exclusive to acute coronary syndrome, aortic dissection, pulmonary embolism, cardiac tamponade, community-acquired pneumonia, pericarditis, musculoskeletal chest wall pain, etc.   ____________________________________________  FINAL CLINICAL IMPRESSION(S) / ED DIAGNOSES  Final diagnoses:  Atypical chest pain  Shortness of breath      NEW MEDICATIONS STARTED DURING THIS VISIT:  New Prescriptions   No medications on file     Loleta Rose, MD 06/14/15 [phone]

## 2016-07-12 DEATH — deceased

## 2016-08-25 IMAGING — CR DG CHEST 2V
2 series · 2 of 2 positions shown · non-contrast
Comparison: Chest radiograph September 16, 2013

CLINICAL DATA: Difficulty breathing for 1 day, lightheadedness with
mid chest pain. History of pneumonia. Smoker.

EXAM:
CHEST  2 VIEW

[chest pa]
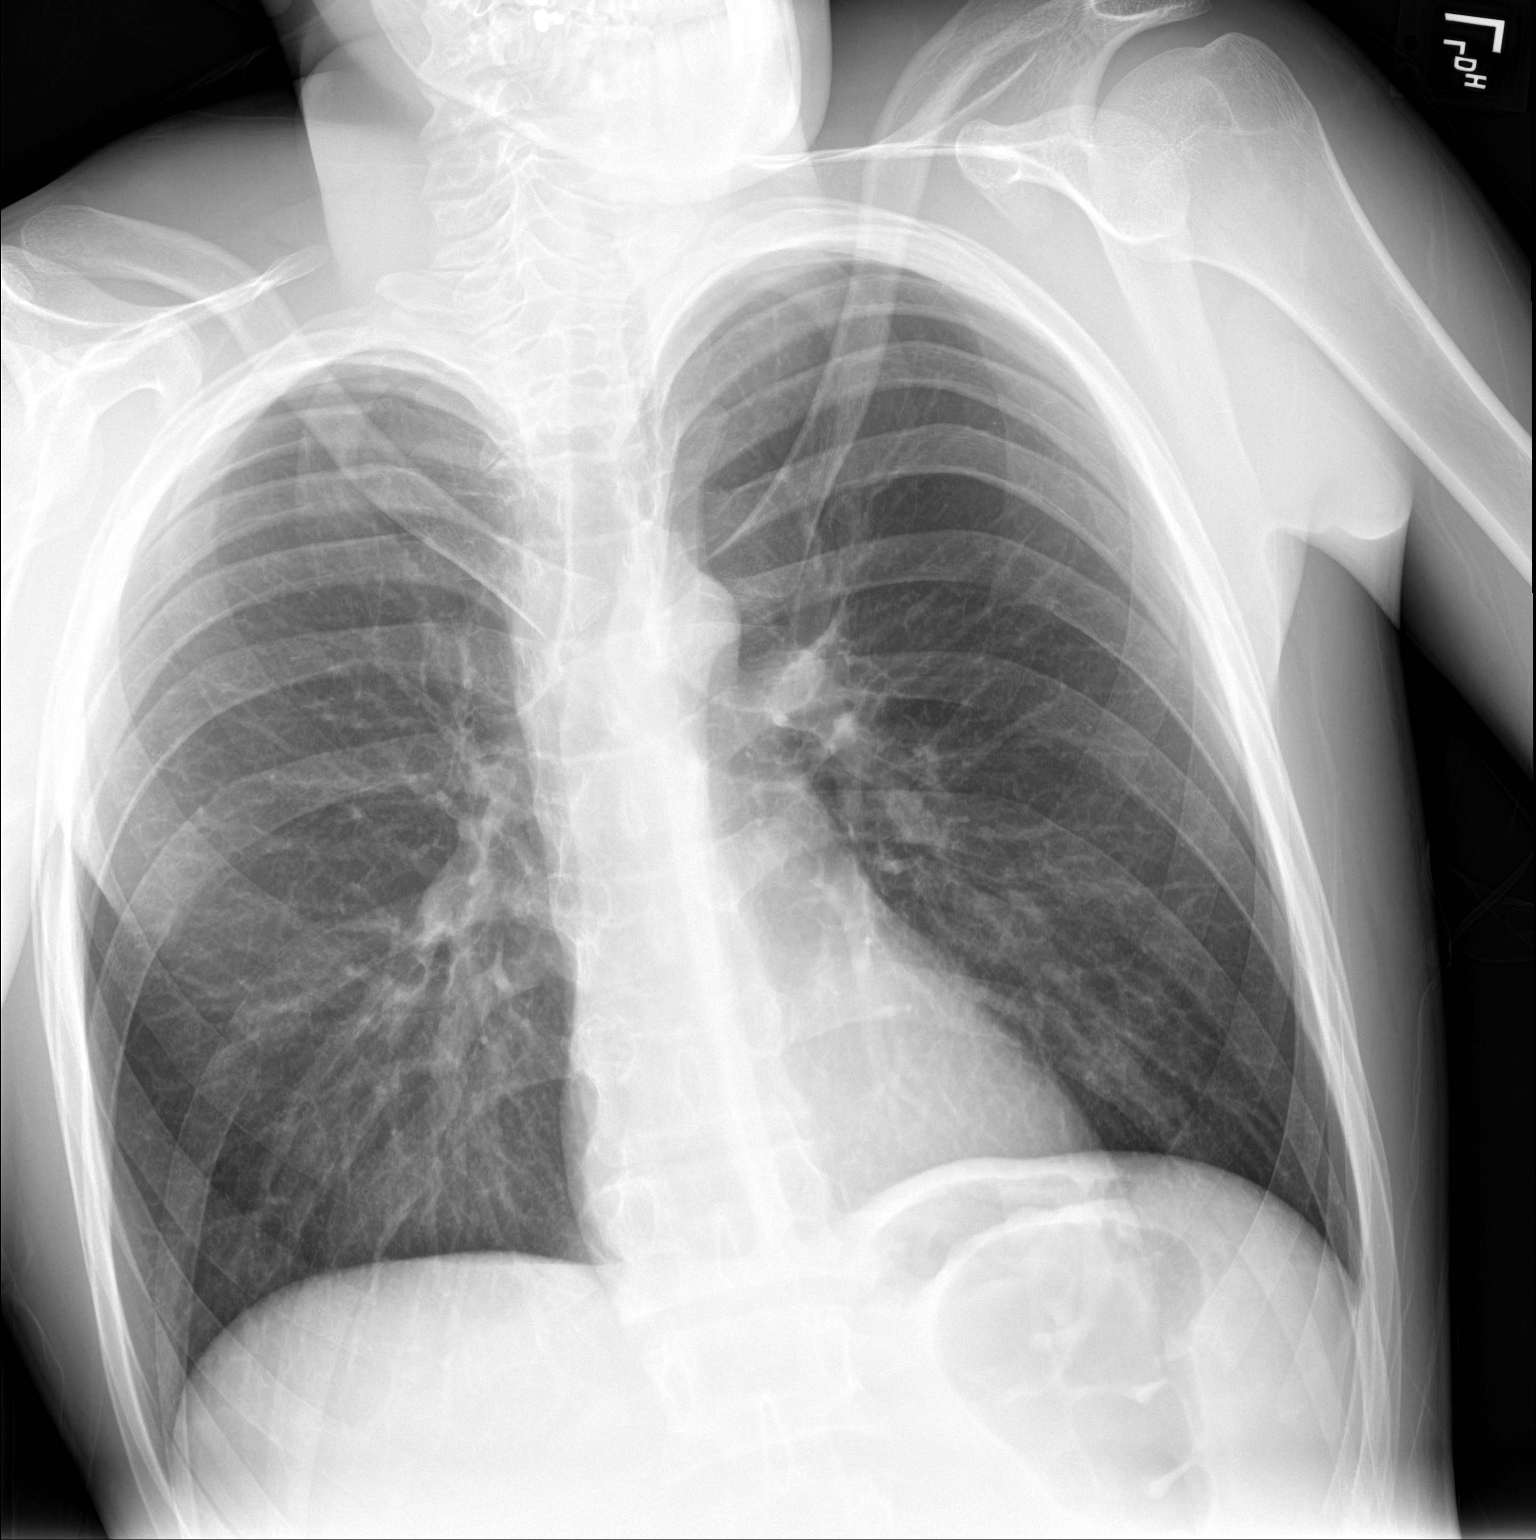

[chest lat]
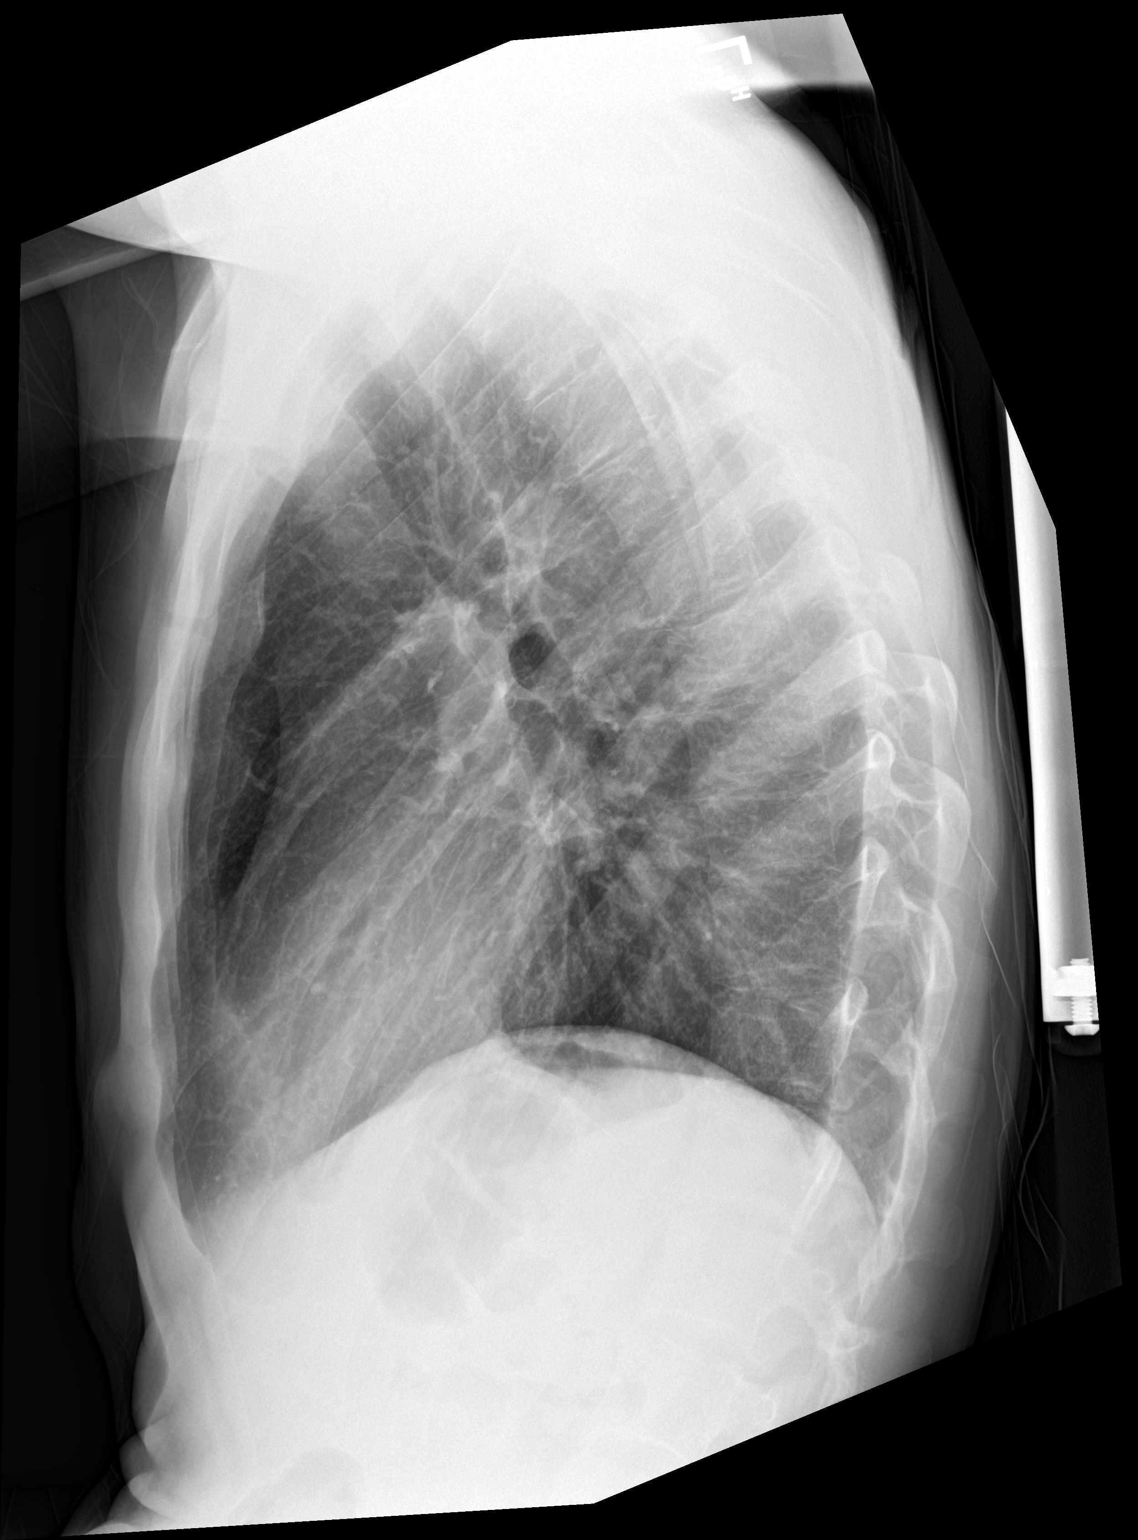

[2 of 2 positions shown; findings below may reference images not displayed]

FINDINGS: Cardiomediastinal silhouette is normal. The lungs are clear without
pleural effusions or focal consolidations. Trachea projects midline
and there is no pneumothorax. Soft tissue planes and included
osseous structures are non-suspicious. Move Mild broad
dextroscoliosis is likely positional as, was not present on prior
radiograph.
IMPRESSION: Normal chest.
